# Patient Record
Sex: Female | Born: 1995 | Race: Asian | Hispanic: No | Marital: Married | Smoking: Never smoker
Health system: Southern US, Community
[De-identification: ages and names within clinical notes are randomized; demographics above are authoritative.]

## PROBLEM LIST (undated history)

## (undated) DIAGNOSIS — O24419 Gestational diabetes mellitus in pregnancy, unspecified control: Secondary | ICD-10-CM

---

## 2022-02-06 ENCOUNTER — Encounter: Payer: Self-pay | Admitting: General Practice

## 2022-02-17 ENCOUNTER — Inpatient Hospital Stay (HOSPITAL_COMMUNITY): Payer: Medicaid Other

## 2022-02-17 ENCOUNTER — Inpatient Hospital Stay (HOSPITAL_COMMUNITY)
Admission: AD | Admit: 2022-02-17 | Discharge: 2022-02-17 | Disposition: A | Discharge status: Home / Self Care | Payer: Medicaid Other | Attending: Obstetrics and Gynecology | Admitting: Obstetrics and Gynecology

## 2022-02-17 ENCOUNTER — Encounter (HOSPITAL_COMMUNITY): Payer: Self-pay | Admitting: Obstetrics and Gynecology

## 2022-02-17 DIAGNOSIS — R1032 Left lower quadrant pain: Secondary | ICD-10-CM | POA: Diagnosis not present

## 2022-02-17 DIAGNOSIS — Z3A08 8 weeks gestation of pregnancy: Secondary | ICD-10-CM | POA: Diagnosis not present

## 2022-02-17 DIAGNOSIS — O26891 Other specified pregnancy related conditions, first trimester: Secondary | ICD-10-CM | POA: Diagnosis not present

## 2022-02-17 DIAGNOSIS — Z6790 Unspecified blood type, Rh positive: Secondary | ICD-10-CM

## 2022-02-17 DIAGNOSIS — O021 Missed abortion: Secondary | ICD-10-CM | POA: Diagnosis not present

## 2022-02-17 DIAGNOSIS — R109 Unspecified abdominal pain: Secondary | ICD-10-CM | POA: Diagnosis present

## 2022-02-17 HISTORY — DX: Gestational diabetes mellitus in pregnancy, unspecified control: O24.419

## 2022-02-17 LAB — URINALYSIS, ROUTINE W REFLEX MICROSCOPIC
Bilirubin Urine: NEGATIVE
Glucose, UA: NEGATIVE mg/dL
Hgb urine dipstick: NEGATIVE
Ketones, ur: NEGATIVE mg/dL
Leukocytes,Ua: NEGATIVE
Nitrite: NEGATIVE
Protein, ur: NEGATIVE mg/dL
Specific Gravity, Urine: 1.024 (ref 1.005–1.030)
pH: 6 (ref 5.0–8.0)

## 2022-02-17 LAB — ABO/RH: ABO/RH(D): O POS

## 2022-02-17 LAB — WET PREP, GENITAL
Clue Cells Wet Prep HPF POC: NONE SEEN
Sperm: NONE SEEN
Trich, Wet Prep: NONE SEEN
WBC, Wet Prep HPF POC: >=10 — AB (ref <10)

## 2022-02-17 LAB — CBC
HCT: 38.6 % (ref 36.0–46.0)
Hemoglobin: 12.8 g/dL (ref 12.0–15.0)
MCH: 27.6 pg (ref 26.0–34.0)
MCHC: 33.2 g/dL (ref 30.0–36.0)
MCV: 83.2 fL (ref 80.0–100.0)
Platelets: 197 10*3/uL (ref 150–400)
RBC: 4.64 MIL/uL (ref 3.87–5.11)
RDW: 13.3 % (ref 11.5–15.5)
WBC: 6.8 10*3/uL (ref 4.0–10.5)
nRBC: 0 % (ref 0.0–0.2)

## 2022-02-17 LAB — POCT PREGNANCY, URINE: Preg Test, Ur: POSITIVE — AB

## 2022-02-17 LAB — HCG, QUANTITATIVE, PREGNANCY: hCG, Beta Chain, Quant, S: 11719 m[IU]/mL — ABNORMAL HIGH (ref <5)

## 2022-02-17 MED ORDER — PROMETHAZINE HCL 12.5 MG PO TABS
12.5000 mg | ORAL_TABLET | Freq: Four times a day (QID) | ORAL | 0 refills | Status: DC | PRN
Start: 2022-02-17 — End: 2022-02-18

## 2022-02-17 MED ORDER — HYDROCODONE-ACETAMINOPHEN 5-325 MG PO TABS: 2.0000 | ORAL_TABLET | ORAL | 0 refills | Status: DC | PRN

## 2022-02-17 MED ORDER — MISOPROSTOL 200 MCG PO TABS
800.0000 ug | ORAL_TABLET | Freq: Once | ORAL | Status: AC
Start: 2022-02-17 — End: 2022-02-17
  Administered 2022-02-17: 800 ug via ORAL
  Filled 2022-02-17: qty 4

## 2022-02-17 MED ORDER — HYDROCODONE-ACETAMINOPHEN 5-325 MG PO TABS
2.0000 | ORAL_TABLET | Freq: Once | ORAL | Status: AC
  Administered 2022-02-17: 2 via ORAL
  Filled 2022-02-17: qty 2

## 2022-02-17 MED ORDER — IBUPROFEN 600 MG PO TABS: 600.0000 mg | ORAL_TABLET | Freq: Four times a day (QID) | ORAL | 3 refills | Status: DC | PRN

## 2022-02-17 NOTE — MAU Provider Note (Addendum)
?History  ?  ? ?CSN: [id number] ? ?Arrival date and time: 02/17/22 1015 ? ? Event Date/Time  ? First Provider Initiated Contact with Patient 02/17/22 1119   ?  ? ?Chief Complaint  ?Patient presents with  ? Abdominal Pain  ? Constipation  ? Nausea  ? Emesis  ? Vaginal Discharge  ? Vaginal Itching  ? ?HPI ? ?Kristina Hutchinson 26 y.o. presents to MAU with complaints of left sided abdominal pain and concerns for constipation that began 1 day ago. Pt describes the pain as intermittent and "cramping like". She states that the pain is not relieved with movement or rest. Pt states she is currently experiencing hemorrhoids and using OTC PrepH cream with "some relief." Pt states that she had a positive pregnancy test at home 1 week ago and has a confirmation of pregnancy test scheduled for 03/21/22 with [facility]. Pt denies vaginal bleeding, and leaking of fluid.  ? ?OB History   ? ? Gravida  ?3  ? Para  ?   ? Term  ?   ? Preterm  ?   ? AB  ?   ? Living  ?2  ?  ? ? SAB  ?   ? IAB  ?   ? Ectopic  ?   ? Multiple  ?   ? Live Births  ?2  ?   ?  ?  ? ? ?Past Medical History:  ?Diagnosis Date  ? Gestational diabetes   ? ? ?Past Surgical History:  ?Procedure Laterality Date  ? CESAREAN SECTION    ? ? ?History reviewed. No pertinent family history. ? ?Social History  ? ?Tobacco Use  ? Smoking status: Never  ?  Passive exposure: Never  ? Smokeless tobacco: Never  ?Vaping Use  ? Vaping Use: Never used  ?Substance Use Topics  ? Alcohol use: Never  ? Drug use: Never  ? ? ?Allergies: No Known Allergies ? ?Medications Prior to Admission  ?Medication Sig Dispense Refill Last Dose  ? Prenatal Vit-Fe Fumarate-FA (MULTIVITAMIN-PRENATAL) 27-0.8 MG TABS tablet Take 1 tablet by mouth daily at 12 noon.   02/16/2022  ? ? ?Review of Systems  ?Constitutional:  Negative for appetite change.  ?Respiratory:  Negative for shortness of breath, wheezing and stridor.   ?Gastrointestinal:  Positive for abdominal pain and constipation. Negative for diarrhea,  nausea, rectal pain and vomiting.  ?Genitourinary:  Negative for frequency, hematuria, pelvic pain, urgency, vaginal bleeding, vaginal discharge and vaginal pain.  ?Skin: Negative.   ?Physical Exam  ? ?Blood pressure (!) 106/57, pulse 62, temperature 98.6 ?F (37 ?C), temperature source Oral, resp. rate 15, height 5\' 2"  (1.575 m), weight 79 kg, last menstrual period 12/06/2021, SpO2 100 %. ? ?Physical Exam ?Abdominal:  ?   General: There is no distension.  ?   Palpations: Abdomen is soft.  ?   Tenderness: There is no abdominal tenderness. There is no guarding.  ?Skin: ?   General: Skin is warm and dry.  ?Neurological:  ?   Mental Status: She is oriented to person, place, and time.  ?Psychiatric:     ?   Mood and Affect: Mood normal.     ?   Behavior: Behavior normal.  ? ? ?MAU Course  ?Procedures ? ?MDM ?Orders Placed This Encounter  ?Procedures  ? Wet prep, genital  ? 12/08/2021 OB Comp Less 14 Wks  ? Urinalysis, Routine w reflex microscopic Urine, Clean Catch  ? CBC  ? hCG, quantitative, pregnancy  ? Nursing  communication  ? Pregnancy, urine POC  ? ABO/Rh  ? ?Results for orders placed or performed during the hospital encounter of 02/17/22 (from the past 24 hour(s))  ?Pregnancy, urine POC     Status: Abnormal  ? Collection Time: 02/17/22 10:50 AM  ?Result Value Ref Range  ? Preg Test, Ur POSITIVE (A) NEGATIVE  ?Urinalysis, Routine w reflex microscopic Urine, Clean Catch     Status: None  ? Collection Time: 02/17/22 11:04 AM  ?Result Value Ref Range  ? Color, Urine YELLOW YELLOW  ? APPearance CLEAR CLEAR  ? Specific Gravity, Urine 1.024 1.005 - 1.030  ? pH 6.0 5.0 - 8.0  ? Glucose, UA NEGATIVE NEGATIVE mg/dL  ? Hgb urine dipstick NEGATIVE NEGATIVE  ? Bilirubin Urine NEGATIVE NEGATIVE  ? Ketones, ur NEGATIVE NEGATIVE mg/dL  ? Protein, ur NEGATIVE NEGATIVE mg/dL  ? Nitrite NEGATIVE NEGATIVE  ? Leukocytes,Ua NEGATIVE NEGATIVE  ?Wet prep, genital     Status: Abnormal  ? Collection Time: 02/17/22 11:44 AM  ? Specimen: Vaginal   ?Result Value Ref Range  ? Yeast Wet Prep HPF POC PRESENT (A) NONE SEEN  ? Trich, Wet Prep NONE SEEN NONE SEEN  ? Clue Cells Wet Prep HPF POC NONE SEEN NONE SEEN  ? WBC, Wet Prep HPF POC >=10 (A) <10  ? Sperm NONE SEEN   ?CBC     Status: None  ? Collection Time: 02/17/22 11:56 AM  ?Result Value Ref Range  ? WBC 6.8 4.0 - 10.5 K/uL  ? RBC 4.64 3.87 - 5.11 MIL/uL  ? Hemoglobin 12.8 12.0 - 15.0 g/dL  ? HCT 38.6 36.0 - 46.0 %  ? MCV 83.2 80.0 - 100.0 fL  ? MCH 27.6 26.0 - 34.0 pg  ? MCHC 33.2 30.0 - 36.0 g/dL  ? RDW 13.3 11.5 - 15.5 %  ? Platelets 197 150 - 400 K/uL  ? nRBC 0.0 0.0 - 0.2 %  ?ABO/Rh     Status: None (Preliminary result)  ? Collection Time: 02/17/22 11:56 AM  ?Result Value Ref Range  ? ABO/RH(D) PENDING   ? ?US OB Comp Less 14 Wks ? ?Result Date: 02/17/2022 ?CLINICAL DATA:  LEFT lower quadrant cramping for 1 day, first trimester pregnancy, LMP 12/06/2021, no quantitative beta HCG currently available for comparison EXAM: OBSTETRIC <14 WK US AND TRANSVAGINAL OB US TECHNIQUE: Both transabdominal and transvaginal ultrasound examinations were performed for complete evaluation of the gestation as well as the maternal uterus, adnexal regions, and pelvic cul-de-sac. Transvaginal technique was performed to assess early pregnancy. COMPARISON:  None FINDINGS: Intrauterine gestational sac: Present, single, slightly irregular Yolk sac:  Not identified Embryo:  Present Cardiac Activity: Not identified Heart Rate: N/A  bpm CRL:  21.2 mm   8 w   5 d                  US EDC: 09/24/2022 Subchorionic hemorrhage:  None visualized. Maternal uterus/adnexae: Remainder of uterus unremarkable. Ovaries normal appearance. No free pelvic fluid or adnexal masses. IMPRESSION: Intrauterine gestational sac containing a fetal pole which corresponds to 8 weeks 5 days EGA. No fetal cardiac activity identified, and no yolk sac seen. Findings meet definitive criteria for failed pregnancy. This follows SRU consensus guidelines: Diagnostic  Criteria for Nonviable Pregnancy Early in the First Trimester. Macy Mis Engl J Med [phone];[id number]. Electronically Signed   By: Ulyses SouthwardMark  Boles M.D.   On: 02/17/2022 12:39   ? ?Early Intrauterine Pregnancy Failure ? ?__x_  Documented intrauterine pregnancy failure less  than or equal to [redacted] weeks gestation ? ?__x_  No serious current illness ? ?__x_  Counseling by practitioner or physician ? ? ?_x__  Medication dispensed ? ? _x__   Cytotec 800 mcg  __   Intravaginally by patient at home ?  ?      _x_   Orally in MAU ? ?      __   Rectally by patient at home ? ?      __   Rectally by RN in MAU ? ?__x_  Ibuprofen 600 mg 1 tablet by mouth every 6 hours as needed #30 ? ?_x__  Hydrocodone/acetaminophen 5/325 mg by mouth every 4 to 6 hours as needed ? ?__x_  Phenergan 12.5 mg by mouth every 4 hours as needed for nausea ? ?Meds ordered this encounter  ?Medications  ? misoprostol (CYTOTEC) tablet 800 mcg  ?  Missed Abortion  ? HYDROcodone-acetaminophen (NORCO/VICODIN) 5-325 MG per tablet 2 tablet  ? HYDROcodone-acetaminophen (NORCO/VICODIN) 5-325 MG tablet  ?  Sig: Take 2 tablets by mouth every 4 (four) hours as needed for up to 3 days.  ?  Dispense:  6 tablet  ?  Refill:  0  ?  Order Specific Question:   Supervising Provider  ?  AnswerRandolph Bing [[id number]]  ? promethazine (PHENERGAN) 12.5 MG tablet  ?  Sig: Take 1 tablet (12.5 mg total) by mouth every 6 (six) hours as needed for nausea or vomiting.  ?  Dispense:  30 tablet  ?  Refill:  0  ?  Order Specific Question:   Supervising Provider  ?  AnswerRandolph Bing [[id number]]  ? ibuprofen (ADVIL) 600 MG tablet  ?  Sig: Take 1 tablet (600 mg total) by mouth every 6 (six) hours as needed.  ?  Dispense:  60 tablet  ?  Refill:  3  ?  Order Specific Question:   Supervising Provider  ?  AnswerRandolph Bing [[id number]]  ?  ?Assessment and Plan  ?Missed AB noted on ultrasound. Plan of care discussed with Pt and husband by S. Weinhold CNM and S. Suzie Portela CNM. Medication  intervention versus expectant management options discussed in depth with patient and husband. Bleeding expectations at home discussed, and bleeding precautions provided. All questions and concerns of pt and husband rev

## 2022-02-17 NOTE — MAU Note (Signed)
...  Kristina Hutchinson is a 26 y.o. at approximately [redacted]w[redacted]d here in MAU reporting: Left lower abdominal cramping that began yesterday around 1400. She reports she has been constipated but was able to pass a small amount of stool yesterday. She is also reporting occasional nausea and vomits on some mornings. Denies VB or LOF. Endorses malodorous thick white discharge accompanied with itching for two weeks now. Last IC two days ago. ? ?First OB appointment on 5/30 but unsure of where. Reports having pregnancy verified with [facility]. ? ?LMP: 12/06/2021 ? ?Pain score: 5/10 left lower to left mid abdomen ? ?FHT: unable to doppler ?Lab orders placed from triage:  UA ? ?

## 2022-02-17 NOTE — Discharge Instructions (Signed)
What to expect after the misoprostol  Cramping, moderate to heavy bleeding, and moderate pain are normal parts of the abortion process as your uterus passes the pregnancy. Most women pass blood clots.  Cramping usually starts one to four hours after you place the misoprostol in your vagina. Bleeding usually starts between 30 minutes to four hours after you place the misoprostol in your vagina. However, it can take up to 24 hours for some women. Heavy bleeding and strong cramps usually last between one and four hours. Call or return to the hospital if you soak through more than two large maxi-pads per hour for three hours in a row.  For pain: Resting and using a heating pad or hot water bottle may help. Both Vicodin and ibuprofen usually help with the pain. You may use either one or both together. Call if your pain is not manageable with Vicodin and ibuprofen.  Vicodin: You may take one or two tablets of Vicodin every four to six hours. You may take the first pill as soon as you feel you need something for the pain. Vicodin may make you feel sleepy, nauseated, or dizzy. Do not exceed this dose.  Ibuprofen (Motrin, Advil, etc.): You may take 800mg of ibuprofen (four over-the-counter tablets) every six to eight hours. You may take the first dose of pills as soon as you feel you need something for the pain. Ibuprofen usually does not cause side effects such as sleepiness, nausea, or dizziness.  Other misoprostol side effects:  The following side effects are not dangerous and usually only last one to four hours. If any of these side effects make you very uncomfortable, you may treat your symptoms with over-the-counter medicines. Call if over-the-counter medicines do not relieve your symptoms.  Nausea or vomiting  Call if you vomit several times and cannot hold down liquids. Over the counter treatment: Benadryl 25mg to 50mg every six hours as needed, or Dramamine 25mg to 50mg every six hours as  needed.  Diarrhea  Call if you have several episodes of diarrhea lasting more than four to five hours and you are having trouble drinking liquids (you may be getting dehydrated). Over the counter treatment: Imodium 2 tablets (4mg) once.  Fever, chills  Misoprostol may cause fever or chills in the first 24 hours. After 24 hours, fever or chills may be a sign of an infection and you should call the clinic. If you already took Tylenol, Vicodin (which has 500mg of Tylenol in it), or ibuprofen for pain, do not take more of the same medication for fever or chills. Call the clinic if your fever is higher than 101 F (or 39 C). Over the counter treatment: Tylenol 500 to 650mg every four hours, Ibuprofen 800mg (four over-the-counter pills) every six to eight hours. How to tell if the abortion is complete  Most women have bleeding and painful cramping. As you pass the pregnancy, the bleeding is usually heavy and the cramping very strong. This usually lasts one to four hours. Most women pass some blood clots in the toilet and the pregnancy is often one of those clots. After the pregnancy passes, the cramps decrease and the bleeding slows down significantly.  Within a few hours after passing the pregnancy, cramps and bleeding should be much improved  

## 2022-02-18 ENCOUNTER — Telehealth: Payer: Self-pay

## 2022-02-18 ENCOUNTER — Other Ambulatory Visit: Payer: Self-pay | Admitting: Family Medicine

## 2022-02-18 MED ORDER — IBUPROFEN 600 MG PO TABS: 600.0000 mg | ORAL_TABLET | Freq: Four times a day (QID) | ORAL | 3 refills | Status: DC | PRN

## 2022-02-18 MED ORDER — HYDROCODONE-ACETAMINOPHEN 5-325 MG PO TABS: 2.0000 | ORAL_TABLET | ORAL | 0 refills | Status: AC | PRN

## 2022-02-18 MED ORDER — PROMETHAZINE HCL 12.5 MG PO TABS: 12.5000 mg | ORAL_TABLET | Freq: Four times a day (QID) | ORAL | 0 refills | Status: DC | PRN

## 2022-02-18 NOTE — Telephone Encounter (Signed)
MD on call called this RNCM back and will change the prescriptions over to [facility] in [place]. per request.  ?

## 2022-02-18 NOTE — Telephone Encounter (Signed)
Called office and left message for provider to call back re patients original prescribed medication is  MS. Weinhold.  She is not on today but her on call has been paged. ?

## 2022-02-18 NOTE — Telephone Encounter (Signed)
Please see subsequent encounters.

## 2022-02-21 LAB — GC/CHLAMYDIA PROBE AMP (~~LOC~~) NOT AT ARMC
Chlamydia: NEGATIVE
Comment: NEGATIVE
Comment: NORMAL
Neisseria Gonorrhea: NEGATIVE

## 2022-02-22 ENCOUNTER — Ambulatory Visit (INDEPENDENT_AMBULATORY_CARE_PROVIDER_SITE_OTHER): Payer: Medicaid Other | Admitting: Family Medicine

## 2022-02-22 ENCOUNTER — Encounter: Payer: Self-pay | Admitting: Family Medicine

## 2022-02-22 VITALS — BP 99/58 | HR 64 | Wt 175.0 lb

## 2022-02-22 DIAGNOSIS — O021 Missed abortion: Secondary | ICD-10-CM | POA: Diagnosis not present

## 2022-02-22 NOTE — Progress Notes (Signed)
? ?  Subjective:  ? ? Patient ID: Kristina Hutchinson, female    DOB: 23-Jan-1996, 26 y.o.   MRN: [id number] ? ?HPI ?This is a 26 year old G3 P2-0-0-2 who presents with missed AB that was diagnosed in the MAU on 4/28.  She was given Cytotec in the MAU.  She states that she had heavy bleeding, cramping, passing blood clots and tissue.  She is still having some spotting, but not cramping. ? ?Review of Systems ? ?   ?Objective:  ? Physical Exam ?Vitals reviewed.  ?Constitutional:   ?   Appearance: Normal appearance.  ?Abdominal:  ?   General: Abdomen is flat. There is no distension.  ?   Palpations: Abdomen is soft. There is no mass.  ?   Tenderness: There is no abdominal tenderness. There is no guarding or rebound.  ?   Hernia: No hernia is present.  ?Skin: ?   Capillary Refill: Capillary refill takes less than 2 seconds.  ?Neurological:  ?   Mental Status: She is alert.  ?Psychiatric:     ?   Mood and Affect: Mood normal.     ?   Behavior: Behavior normal.     ?   Thought Content: Thought content normal.     ?   Judgment: Judgment normal.  ? ? ?   ?Assessment & Plan:  ?1. Missed ab ?Check hCG today.  I discussed with the patient that this is something that is fairly common and that approximately 15% of pregnancies in her age range and up in a miscarriage.  Also discussed that this does not impact fertility and that she should still be able to get pregnant.  I did advise that she wait at least 2-3 cycles before attempting to become pregnant again.  We will follow-up in 3 weeks to assure that everything is completed. ?- Beta hCG quant (ref lab) ? ?

## 2022-02-23 LAB — BETA HCG QUANT (REF LAB): hCG Quant: 3338 m[IU]/mL

## 2022-02-24 ENCOUNTER — Emergency Department (HOSPITAL_BASED_OUTPATIENT_CLINIC_OR_DEPARTMENT_OTHER)
Admission: EM | Admit: 2022-02-24 | Discharge: 2022-02-25 | Disposition: A | Discharge status: Home / Self Care | Payer: Medicaid Other | Attending: Emergency Medicine | Admitting: Emergency Medicine

## 2022-02-24 ENCOUNTER — Other Ambulatory Visit: Payer: Self-pay | Admitting: Family Medicine

## 2022-02-24 ENCOUNTER — Encounter (HOSPITAL_BASED_OUTPATIENT_CLINIC_OR_DEPARTMENT_OTHER): Payer: Self-pay | Admitting: Emergency Medicine

## 2022-02-24 ENCOUNTER — Other Ambulatory Visit: Payer: Self-pay

## 2022-02-24 DIAGNOSIS — R1030 Lower abdominal pain, unspecified: Secondary | ICD-10-CM | POA: Diagnosis not present

## 2022-02-24 DIAGNOSIS — O021 Missed abortion: Secondary | ICD-10-CM | POA: Insufficient documentation

## 2022-02-24 DIAGNOSIS — O26891 Other specified pregnancy related conditions, first trimester: Secondary | ICD-10-CM | POA: Insufficient documentation

## 2022-02-24 LAB — CBC WITH DIFFERENTIAL/PLATELET
Abs Immature Granulocytes: 0.04 10*3/uL (ref 0.00–0.07)
Basophils Absolute: 0.1 10*3/uL (ref 0.0–0.1)
Basophils Relative: 1 %
Eosinophils Absolute: 0.3 10*3/uL (ref 0.0–0.5)
Eosinophils Relative: 3 %
HCT: 35.3 % — ABNORMAL LOW (ref 36.0–46.0)
Hemoglobin: 11.7 g/dL — ABNORMAL LOW (ref 12.0–15.0)
Immature Granulocytes: 0 %
Lymphocytes Relative: 31 %
Lymphs Abs: 3.3 10*3/uL (ref 0.7–4.0)
MCH: 27.7 pg (ref 26.0–34.0)
MCHC: 33.1 g/dL (ref 30.0–36.0)
MCV: 83.5 fL (ref 80.0–100.0)
Monocytes Absolute: 0.7 10*3/uL (ref 0.1–1.0)
Monocytes Relative: 6 %
Neutro Abs: 6.2 10*3/uL (ref 1.7–7.7)
Neutrophils Relative %: 59 %
Platelets: 229 10*3/uL (ref 150–400)
RBC: 4.23 MIL/uL (ref 3.87–5.11)
RDW: 13.2 % (ref 11.5–15.5)
WBC: 10.6 10*3/uL — ABNORMAL HIGH (ref 4.0–10.5)
nRBC: 0 % (ref 0.0–0.2)

## 2022-02-24 MED ORDER — OXYCODONE-ACETAMINOPHEN 5-325 MG PO TABS
1.0000 | ORAL_TABLET | Freq: Four times a day (QID) | ORAL | 0 refills | Status: DC | PRN
Start: 2022-02-24 — End: 2022-03-15

## 2022-02-24 MED ORDER — OXYCODONE-ACETAMINOPHEN 5-325 MG PO TABS
1.0000 | ORAL_TABLET | Freq: Four times a day (QID) | ORAL | 0 refills | Status: DC | PRN
Start: 2022-02-24 — End: 2022-02-24

## 2022-02-24 MED ORDER — LACTATED RINGERS IV BOLUS
1000.0000 mL | Freq: Once | INTRAVENOUS | Status: AC
  Administered 2022-02-24: 1000 mL via INTRAVENOUS

## 2022-02-24 MED ORDER — FENTANYL CITRATE PF 50 MCG/ML IJ SOSY
50.0000 ug | PREFILLED_SYRINGE | Freq: Once | INTRAMUSCULAR | Status: AC
  Administered 2022-02-24: 50 ug via INTRAVENOUS
  Filled 2022-02-24: qty 1

## 2022-02-24 MED ORDER — MISOPROSTOL 200 MCG PO TABS: ORAL_TABLET | ORAL | 0 refills | Status: DC

## 2022-02-24 NOTE — Progress Notes (Signed)
Called patient and her husband with results. Hormone level still high - 3300. Will repeat cytotec. Instructions given to patient. Percocet also sent to pharmacy.  ?

## 2022-02-24 NOTE — ED Triage Notes (Signed)
Pt presents c/o lower abdominal pain, vaginal bleeding, and n/v x 2 hours. Recently seen at MAU on 4/28 for missed abortion. Took cytotec once. Was told her hcg levels were still elevated and prescribed another cytotec, but did not take it yet due to current sx.  ?

## 2022-02-24 NOTE — ED Provider Notes (Signed)
? ?[facility] EMERGENCY DEPARTMENT  ?Provider Note ? ?CSN: [id number] ?Arrival date & time: 02/24/22 2315 ? ?History ?Chief Complaint  ?Patient presents with  ? Abdominal Pain  ? Emesis  ? ? ?Kristina Hutchinson is a 26 y.o. female seen at MAU on 4/28 for early pregnancy bleeding and found to have miscarriage. Given cytotec and had heavy bleeding and cramping the following day and then only spotting afterwards. She was seen in Ob clinic for follow up 5/3 and had labs drawn. She was called today due to HCG still being >3000 and advised another dose of cytotec which she has not yet taken (planning to take it in the morning) but pain and bleeding returned this evening. Heavy, clots and lower abdominal cramping. No fevers. No vomiting.  ? ? ?Home Medications ?Prior to Admission medications   ?Medication Sig Start Date End Date Taking? Authorizing Provider  ?ibuprofen (ADVIL) 600 MG tablet Take 1 tablet (600 mg total) by mouth every 6 (six) hours as needed. ?Patient not taking: Reported on 02/22/2022 02/18/22   Reva Bores, MD  ?misoprostol (CYTOTEC) 200 MCG tablet Take 2 tablets (400 mcg total) by mouth 3 (three) times daily for 3 days. Place four tablets in between your gums and cheeks (two tablets on each side) as instructed 02/25/22 02/28/22  Pollyann Savoy, MD  ?oxyCODONE-acetaminophen (PERCOCET/ROXICET) 5-325 MG tablet Take 1-2 tablets by mouth every 6 (six) hours as needed. 02/24/22   Levie Heritage, DO  ?Prenatal Vit-Fe Fumarate-FA (MULTIVITAMIN-PRENATAL) 27-0.8 MG TABS tablet Take 1 tablet by mouth daily at 12 noon.    [provider]  ?promethazine (PHENERGAN) 12.5 MG tablet Take 1 tablet (12.5 mg total) by mouth every 6 (six) hours as needed for nausea or vomiting. ?Patient not taking: Reported on 02/22/2022 02/18/22   Reva Bores, MD  ? ? ? ?Allergies    ?Patient has no known allergies. ? ? ?Review of Systems   ?Review of Systems ?Please see HPI for pertinent positives and negatives ? ?Physical  Exam ?BP (!) 93/57   Pulse 75   Temp 97.9 ?F (36.6 ?C)   Resp 16   LMP 12/06/2021 (Exact Date)   SpO2 100%  ? ?Physical Exam ?Vitals and nursing note reviewed.  ?Constitutional:   ?   Appearance: Normal appearance.  ?HENT:  ?   Head: Normocephalic and atraumatic.  ?   Nose: Nose normal.  ?   Mouth/Throat:  ?   Mouth: Mucous membranes are moist.  ?Eyes:  ?   Extraocular Movements: Extraocular movements intact.  ?   Conjunctiva/sclera: Conjunctivae normal.  ?Cardiovascular:  ?   Rate and Rhythm: Normal rate.  ?Pulmonary:  ?   Effort: Pulmonary effort is normal.  ?   Breath sounds: Normal breath sounds.  ?Abdominal:  ?   General: Abdomen is flat.  ?   Palpations: Abdomen is soft.  ?   Tenderness: There is abdominal tenderness in the suprapubic area. There is no guarding.  ?Musculoskeletal:     ?   General: No swelling. Normal range of motion.  ?   Cervical back: Neck supple.  ?Skin: ?   General: Skin is warm and dry.  ?Neurological:  ?   General: No focal deficit present.  ?   Mental Status: She is alert.  ?Psychiatric:     ?   Mood and Affect: Mood normal.  ? ? ?ED Results / Procedures / Treatments   ?EKG ?None ? ?Procedures ?Procedures ? ?Medications Ordered  in the ED ?Medications  ?lactated ringers bolus 1,000 mL (0 mLs Intravenous Stopped 02/25/22 0138)  ?fentaNYL (SUBLIMAZE) injection 50 mcg (50 mcg Intravenous Given 02/24/22 2350)  ?misoprostol (CYTOTEC) tablet 800 mcg (800 mcg Oral Given 02/25/22 0139)  ? ? ?Initial Impression and Plan ? Patient with vaginal bleeding in setting of recent miscarriage. Will check CBC and HCG, IVF for borderline BP and fentanyl for pain.  ? ?ED Course  ? ?Clinical Course as of 02/25/22 0157  ?Sat Feb 25, 2022  ?0000 CBC with mild decrease in Hgb compared to recent MAU visit.  [CS]  ?0130 Spoke with Dr. Despina Hidden, on call for Ob, he recommends giving her Cytotec tonight, then TID for the next three days. She has Rx for pain medications if needed. Advised to return to MAU if  pain is uncontrolled, she feels lightheaded or dizzy or if she develops a fever. Patient and family at bedside are in agreement.  [CS]  ?  ?Clinical Course User Index ?[CS] Pollyann Savoy, MD  ? ? ? ?MDM Rules/Calculators/A&P ?Medical Decision Making ?Given presenting complaint, I considered that admission might be necessary. After review of results from ED lab and/or imaging studies, admission to the hospital is not indicated at this time.  ? ? ?Problems Addressed: ?Missed abortion: acute illness or injury ? ?Amount and/or Complexity of Data Reviewed ?Labs: ordered. Decision-making details documented in ED Course. ? ?Risk ?Prescription drug management. ?Decision regarding hospitalization. ? ? ? ?Final Clinical Impression(s) / ED Diagnoses ?Final diagnoses:  ?Missed abortion  ? ? ?Rx / DC Orders ?ED Discharge Orders   ? ?      Ordered  ?  misoprostol (CYTOTEC) 200 MCG tablet  3 times daily       ? 02/25/22 0156  ? ?  ?  ? ?  ? ?  ?Pollyann Savoy, MD ?02/25/22 0157 ? ?

## 2022-02-25 LAB — HCG, QUANTITATIVE, PREGNANCY: hCG, Beta Chain, Quant, S: 2767 m[IU]/mL — ABNORMAL HIGH (ref <5)

## 2022-02-25 MED ORDER — MISOPROSTOL 200 MCG PO TABS
800.0000 ug | ORAL_TABLET | Freq: Once | ORAL | Status: AC
  Administered 2022-02-25: 800 ug via ORAL
  Filled 2022-02-25: qty 4

## 2022-02-25 MED ORDER — MISOPROSTOL 200 MCG PO TABS: 400.0000 ug | ORAL_TABLET | Freq: Three times a day (TID) | ORAL | 0 refills | Status: DC

## 2022-03-01 ENCOUNTER — Ambulatory Visit: Payer: Medicaid Other | Admitting: Obstetrics & Gynecology

## 2022-03-15 ENCOUNTER — Encounter: Payer: Self-pay | Admitting: Family Medicine

## 2022-03-15 ENCOUNTER — Ambulatory Visit (INDEPENDENT_AMBULATORY_CARE_PROVIDER_SITE_OTHER): Payer: Medicaid Other | Admitting: Family Medicine

## 2022-03-15 VITALS — BP 103/67 | HR 72 | Wt 179.0 lb

## 2022-03-15 DIAGNOSIS — O021 Missed abortion: Secondary | ICD-10-CM | POA: Diagnosis not present

## 2022-03-15 MED ORDER — PRENATAL 27-0.8 MG PO TABS
1.0000 | ORAL_TABLET | Freq: Every day | ORAL | 3 refills | Status: AC
Start: 2022-03-15 — End: ?

## 2022-03-15 NOTE — Progress Notes (Signed)
   Subjective:    Patient ID: Kristina Hutchinson, female    DOB: [birth year], 26 y.o.   MRN: [id number]  HPI Patient seen for follow-up of missed AB.  Was last seen in my office 5/5.  Had hCG of greater than 3000.  I had prescribed Cytotec for the patient, however the patient has not taken it yet.  Due to increased bleeding, she went to the emergency department and had repeat labs which showed an hCG of 2700.  She was given Cytotec 800 mcg in the emergency department, then was prescribed Cytotec 400 mcg 3 times daily for the next 3 days.  She was given a prescription for pain medicine.  Since that time she has passed large clots and has had some bleeding.  Since then, she has had no further bleeding or pain.   Review of Systems     Objective:   Physical Exam Vitals reviewed.  Constitutional:      Appearance: Normal appearance.  Abdominal:     General: Abdomen is flat. There is no distension.     Palpations: Abdomen is soft. There is no mass.     Tenderness: There is no abdominal tenderness.  Skin:    General: Skin is warm and dry.     Capillary Refill: Capillary refill takes less than 2 seconds.  Neurological:     General: No focal deficit present.     Mental Status: She is alert.  Psychiatric:        Mood and Affect: Mood normal.        Behavior: Behavior normal.        Thought Content: Thought content normal.      Assessment & Plan:  1. Missed ab Rpt HCG level today.  Patient would like to become pregnant shortly. Recommended continuing PNV.  Recommended waiting 3 months, then trying to get pregnant  - Beta hCG quant (ref lab)

## 2022-03-16 LAB — BETA HCG QUANT (REF LAB): hCG Quant: 3 m[IU]/mL

## 2022-03-21 ENCOUNTER — Encounter: Payer: Self-pay | Admitting: Advanced Practice Midwife

## 2022-05-18 ENCOUNTER — Ambulatory Visit (INDEPENDENT_AMBULATORY_CARE_PROVIDER_SITE_OTHER): Payer: Medicaid Other | Admitting: Family Medicine

## 2022-05-18 ENCOUNTER — Other Ambulatory Visit (HOSPITAL_COMMUNITY)
Admission: RE | Admit: 2022-05-18 | Discharge: 2022-05-18 | Disposition: A | Discharge status: Home / Self Care | Payer: Medicaid Other | Source: Ambulatory Visit | Attending: Family Medicine | Admitting: Family Medicine

## 2022-05-18 VITALS — BP 104/52 | HR 61 | Wt 178.0 lb

## 2022-05-18 DIAGNOSIS — Z3169 Encounter for other general counseling and advice on procreation: Secondary | ICD-10-CM

## 2022-05-18 DIAGNOSIS — N898 Other specified noninflammatory disorders of vagina: Secondary | ICD-10-CM | POA: Diagnosis not present

## 2022-05-18 NOTE — Progress Notes (Signed)
Had full term pregnancy 06-2019,2018 Miscarriage:May 2023

## 2022-05-18 NOTE — Progress Notes (Addendum)
    Subjective:    Patient ID: Kristina Hutchinson is a 26 y.o. female presenting with No chief complaint on file.  on 05/18/2022  HPI: Has h/o C-section x 2. Recent SAB at 8 wk 5d treated with cytotec x 2. Recent normal cycle. On PNV's. Wants another pregnancy. Reports some vaginal itching.  Review of Systems  Constitutional:  Negative for chills and fever.  Respiratory:  Negative for shortness of breath.   Cardiovascular:  Negative for chest pain.  Gastrointestinal:  Negative for abdominal pain, nausea and vomiting.  Genitourinary:  Positive for vaginal discharge. Negative for dysuria.  Skin:  Negative for rash.      Objective:    BP (!) 104/52   Pulse 61   Wt 178 lb (80.7 kg)   LMP 05/02/2022   Breastfeeding No   BMI 32.56 kg/m  Physical Exam Exam conducted with a chaperone present.  Constitutional:      General: She is not in acute distress.    Appearance: She is well-developed.  HENT:     Head: Normocephalic and atraumatic.  Eyes:     General: No scleral icterus. Cardiovascular:     Rate and Rhythm: Normal rate.  Pulmonary:     Effort: Pulmonary effort is normal.  Abdominal:     Palpations: Abdomen is soft.  Musculoskeletal:     Cervical back: Neck supple.  Skin:    General: Skin is warm and dry.  Neurological:     Mental Status: She is alert and oriented to person, place, and time.         Assessment & Plan:  Vaginal discharge - self swab--treat based on results. C/w yeast--rx sent in - Plan: Cervicovaginal ancillary only( [facility]), fluconazole (DIFLUCAN) 150 MG tablet  Encounter for preconception consultation - continue prenatal vits--ok to resume attmepts at pregnancy   Return if symptoms worsen or fail to improve.  Reva Bores, MD 05/18/2022 10:28 AM

## 2022-05-19 LAB — CERVICOVAGINAL ANCILLARY ONLY
Bacterial Vaginitis (gardnerella): NEGATIVE
Candida Glabrata: NEGATIVE
Candida Vaginitis: POSITIVE — AB
Comment: NEGATIVE
Comment: NEGATIVE
Comment: NEGATIVE
Comment: NEGATIVE
Trichomonas: NEGATIVE

## 2022-05-21 MED ORDER — FLUCONAZOLE 150 MG PO TABS: 150.0000 mg | ORAL_TABLET | Freq: Once | ORAL | 0 refills | Status: AC

## 2022-05-21 NOTE — Addendum Note (Signed)
Addended by: Reva Bores on: 05/21/2022 09:27 AM   Modules accepted: Orders

## 2022-05-26 DIAGNOSIS — G44209 Tension-type headache, unspecified, not intractable: Secondary | ICD-10-CM | POA: Diagnosis not present

## 2022-07-19 DIAGNOSIS — Z3201 Encounter for pregnancy test, result positive: Secondary | ICD-10-CM | POA: Diagnosis not present

## 2022-07-19 DIAGNOSIS — O219 Vomiting of pregnancy, unspecified: Secondary | ICD-10-CM | POA: Diagnosis not present

## 2022-07-19 DIAGNOSIS — O99891 Other specified diseases and conditions complicating pregnancy: Secondary | ICD-10-CM | POA: Diagnosis not present

## 2022-07-19 DIAGNOSIS — R531 Weakness: Secondary | ICD-10-CM | POA: Diagnosis not present

## 2022-07-19 DIAGNOSIS — Z3A00 Weeks of gestation of pregnancy not specified: Secondary | ICD-10-CM | POA: Diagnosis not present

## 2022-08-01 ENCOUNTER — Encounter: Payer: Medicaid Other | Admitting: Advanced Practice Midwife

## 2022-08-10 DIAGNOSIS — O210 Mild hyperemesis gravidarum: Secondary | ICD-10-CM | POA: Diagnosis not present

## 2022-08-10 DIAGNOSIS — Z23 Encounter for immunization: Secondary | ICD-10-CM | POA: Diagnosis not present

## 2022-08-10 DIAGNOSIS — Z3A11 11 weeks gestation of pregnancy: Secondary | ICD-10-CM | POA: Diagnosis not present

## 2022-08-10 DIAGNOSIS — O3680X0 Pregnancy with inconclusive fetal viability, not applicable or unspecified: Secondary | ICD-10-CM | POA: Diagnosis not present

## 2022-09-08 DIAGNOSIS — Z361 Encounter for antenatal screening for raised alphafetoprotein level: Secondary | ICD-10-CM | POA: Diagnosis not present

## 2022-09-08 DIAGNOSIS — B3731 Acute candidiasis of vulva and vagina: Secondary | ICD-10-CM | POA: Diagnosis not present

## 2022-09-08 DIAGNOSIS — O23592 Infection of other part of genital tract in pregnancy, second trimester: Secondary | ICD-10-CM | POA: Diagnosis not present

## 2022-09-08 DIAGNOSIS — Z3A15 15 weeks gestation of pregnancy: Secondary | ICD-10-CM | POA: Diagnosis not present

## 2022-10-05 DIAGNOSIS — Z3689 Encounter for other specified antenatal screening: Secondary | ICD-10-CM | POA: Diagnosis not present

## 2022-10-05 DIAGNOSIS — Z3A19 19 weeks gestation of pregnancy: Secondary | ICD-10-CM | POA: Diagnosis not present

## 2022-10-05 DIAGNOSIS — O99212 Obesity complicating pregnancy, second trimester: Secondary | ICD-10-CM | POA: Diagnosis not present

## 2022-10-30 DIAGNOSIS — Z362 Encounter for other antenatal screening follow-up: Secondary | ICD-10-CM | POA: Diagnosis not present

## 2022-12-11 DIAGNOSIS — O99213 Obesity complicating pregnancy, third trimester: Secondary | ICD-10-CM | POA: Diagnosis not present

## 2022-12-11 DIAGNOSIS — Z3689 Encounter for other specified antenatal screening: Secondary | ICD-10-CM | POA: Diagnosis not present

## 2022-12-11 DIAGNOSIS — Z23 Encounter for immunization: Secondary | ICD-10-CM | POA: Diagnosis not present

## 2022-12-11 DIAGNOSIS — E669 Obesity, unspecified: Secondary | ICD-10-CM | POA: Diagnosis not present

## 2022-12-11 DIAGNOSIS — Z3A29 29 weeks gestation of pregnancy: Secondary | ICD-10-CM | POA: Diagnosis not present

## 2022-12-12 DIAGNOSIS — Z3A29 29 weeks gestation of pregnancy: Secondary | ICD-10-CM | POA: Diagnosis not present

## 2022-12-12 DIAGNOSIS — O99810 Abnormal glucose complicating pregnancy: Secondary | ICD-10-CM | POA: Diagnosis not present

## 2023-01-11 DIAGNOSIS — Z3689 Encounter for other specified antenatal screening: Secondary | ICD-10-CM | POA: Diagnosis not present

## 2023-01-11 DIAGNOSIS — O24410 Gestational diabetes mellitus in pregnancy, diet controlled: Secondary | ICD-10-CM | POA: Diagnosis not present

## 2023-01-11 DIAGNOSIS — O99213 Obesity complicating pregnancy, third trimester: Secondary | ICD-10-CM | POA: Diagnosis not present

## 2023-02-05 DIAGNOSIS — Z3689 Encounter for other specified antenatal screening: Secondary | ICD-10-CM | POA: Diagnosis not present

## 2023-02-05 DIAGNOSIS — O24414 Gestational diabetes mellitus in pregnancy, insulin controlled: Secondary | ICD-10-CM | POA: Diagnosis not present

## 2023-02-05 DIAGNOSIS — Z3A36 36 weeks gestation of pregnancy: Secondary | ICD-10-CM | POA: Diagnosis not present

## 2023-02-05 DIAGNOSIS — O24410 Gestational diabetes mellitus in pregnancy, diet controlled: Secondary | ICD-10-CM | POA: Diagnosis not present

## 2023-02-05 DIAGNOSIS — N898 Other specified noninflammatory disorders of vagina: Secondary | ICD-10-CM | POA: Diagnosis not present

## 2023-02-13 DIAGNOSIS — Z6833 Body mass index (BMI) 33.0-33.9, adult: Secondary | ICD-10-CM | POA: Diagnosis not present

## 2023-02-13 DIAGNOSIS — O24424 Gestational diabetes mellitus in childbirth, insulin controlled: Secondary | ICD-10-CM | POA: Diagnosis not present

## 2023-02-20 DIAGNOSIS — O43893 Other placental disorders, third trimester: Secondary | ICD-10-CM | POA: Diagnosis not present

## 2023-02-20 DIAGNOSIS — Z3A39 39 weeks gestation of pregnancy: Secondary | ICD-10-CM | POA: Diagnosis not present

## 2023-02-20 DIAGNOSIS — O34211 Maternal care for low transverse scar from previous cesarean delivery: Secondary | ICD-10-CM | POA: Diagnosis not present

## 2023-02-20 DIAGNOSIS — O24424 Gestational diabetes mellitus in childbirth, insulin controlled: Secondary | ICD-10-CM | POA: Diagnosis not present

## 2023-02-20 DIAGNOSIS — O34219 Maternal care for unspecified type scar from previous cesarean delivery: Secondary | ICD-10-CM | POA: Diagnosis not present

## 2023-02-20 DIAGNOSIS — O99824 Streptococcus B carrier state complicating childbirth: Secondary | ICD-10-CM | POA: Diagnosis not present

## 2023-02-20 DIAGNOSIS — O24429 Gestational diabetes mellitus in childbirth, unspecified control: Secondary | ICD-10-CM | POA: Diagnosis not present

## 2023-02-20 DIAGNOSIS — Z8759 Personal history of other complications of pregnancy, childbirth and the puerperium: Secondary | ICD-10-CM | POA: Diagnosis not present

## 2023-03-28 DIAGNOSIS — Z8632 Personal history of gestational diabetes: Secondary | ICD-10-CM | POA: Diagnosis not present

## 2023-08-06 ENCOUNTER — Ambulatory Visit
Admission: RE | Admit: 2023-08-06 | Discharge: 2023-08-06 | Disposition: A | Discharge status: Home / Self Care | Payer: Medicaid Other | Source: Ambulatory Visit | Attending: Internal Medicine | Admitting: Internal Medicine

## 2023-08-06 VITALS — BP 103/71 | HR 64 | Temp 98.1°F | Resp 16

## 2023-08-06 DIAGNOSIS — H65192 Other acute nonsuppurative otitis media, left ear: Secondary | ICD-10-CM

## 2023-08-06 MED ORDER — AMOXICILLIN 875 MG PO TABS
875.0000 mg | ORAL_TABLET | Freq: Two times a day (BID) | ORAL | 0 refills | Status: AC
Start: 2023-08-06 — End: 2023-08-13

## 2023-08-06 NOTE — ED Triage Notes (Signed)
Pt is here for left ear fuillness x 1 week. Pt denies any other symptoms. Pt took Motrin.

## 2023-08-06 NOTE — Discharge Instructions (Signed)
You have an ear infection so I have sent an antibiotic to treat this.  Follow-up if any symptoms persist or worsen.

## 2023-08-06 NOTE — ED Provider Notes (Signed)
[facility]    CSN: [id number] Arrival date & time: 08/06/23  1658      History   Chief Complaint Chief Complaint  Patient presents with   Ear Fullness    Entered by patient    HPI Shayley Medlin is a 27 y.o. female.   Patient presents with left ear fullness that has been present for approximately 1 week.  Reports that it was originally painful, but she took ibuprofen with resolution of pain.  Denies trauma or foreign body to the ear.  Denies any associated nasal congestion, runny nose, fever.   Ear Fullness    Past Medical History:  Diagnosis Date   Gestational diabetes     There are no problems to display for this patient.   Past Surgical History:  Procedure Laterality Date   CESAREAN SECTION      OB History     Gravida  3   Para  2   Term  2   Preterm      AB  1   Living  2      SAB  1   IAB      Ectopic      Multiple      Live Births  2            Home Medications    Prior to Admission medications   Medication Sig Start Date End Date Taking? Authorizing Provider  amoxicillin (AMOXIL) 875 MG tablet Take 1 tablet (875 mg total) by mouth 2 (two) times daily for 7 days. 08/06/23 08/13/23 Yes Danett Palazzo, Acie Fredrickson, FNP  Prenatal Vit-Fe Fumarate-FA (MULTIVITAMIN-PRENATAL) 27-0.8 MG TABS tablet Take 1 tablet by mouth daily at 12 noon. 03/15/22   Levie Heritage, DO    Family History No family history on file.  Social History Social History   Tobacco Use   Smoking status: Never    Passive exposure: Never   Smokeless tobacco: Never  Vaping Use   Vaping status: Never Used  Substance Use Topics   Alcohol use: Never   Drug use: Never     Allergies   Patient has no known allergies.   Review of Systems Review of Systems Per HPI  Physical Exam Triage Vital Signs ED Triage Vitals  Encounter Vitals Group     BP 08/06/23 1747 103/71     Systolic BP Percentile --      Diastolic BP Percentile --      Pulse Rate  08/06/23 1747 64     Resp 08/06/23 1747 16     Temp 08/06/23 1747 98.1 F (36.7 C)     Temp Source 08/06/23 1747 Oral     SpO2 08/06/23 1747 97 %     Weight --      Height --      Head Circumference --      Peak Flow --      Pain Score 08/06/23 1748 0     Pain Loc --      Pain Education --      Exclude from Growth Chart --    No data found.  Updated Vital Signs BP 103/71 (BP Location: Left Arm)   Pulse 64   Temp 98.1 F (36.7 C) (Oral)   Resp 16   LMP 07/28/2023 (Approximate)   SpO2 97%   Visual Acuity Right Eye Distance:   Left Eye Distance:   Bilateral Distance:    Right Eye Near:   Left Eye Near:  Bilateral Near:     Physical Exam Constitutional:      General: She is not in acute distress.    Appearance: Normal appearance. She is not toxic-appearing or diaphoretic.  HENT:     Head: Normocephalic and atraumatic.     Left Ear: Ear canal and external ear normal. No drainage, swelling or tenderness.  No middle ear effusion. There is no impacted cerumen. No foreign body. Tympanic membrane is erythematous. Tympanic membrane is not perforated or bulging.  Eyes:     Extraocular Movements: Extraocular movements intact.     Conjunctiva/sclera: Conjunctivae normal.  Pulmonary:     Effort: Pulmonary effort is normal.  Neurological:     General: No focal deficit present.     Mental Status: She is alert and oriented to person, place, and time. Mental status is at baseline.  Psychiatric:        Mood and Affect: Mood normal.        Behavior: Behavior normal.        Thought Content: Thought content normal.        Judgment: Judgment normal.      UC Treatments / Results  Labs (all labs ordered are listed, but only abnormal results are displayed) Labs Reviewed - No data to display  EKG   Radiology No results found.  Procedures Procedures (including critical care time)  Medications Ordered in UC Medications - No data to display  Initial Impression /  Assessment and Plan / UC Course  I have reviewed the triage vital signs and the nursing notes.  Pertinent labs & imaging results that were available during my care of the patient were reviewed by me and considered in my medical decision making (see chart for details).     Patient has left otitis media so will treat with amoxicillin antibiotic.  Advised strict follow-up if ear pain persists or worsens.  Patient denies that she is currently breast-feeding so that should be safe.  Patient verbalized understanding and was agreeable with plan. Final Clinical Impressions(s) / UC Diagnoses   Final diagnoses:  Other non-recurrent acute nonsuppurative otitis media of left ear     Discharge Instructions      You have an ear infection so I have sent an antibiotic to treat this.  Follow-up if any symptoms persist or worsen.    ED Prescriptions     Medication Sig Dispense Auth. Provider   amoxicillin (AMOXIL) 875 MG tablet Take 1 tablet (875 mg total) by mouth 2 (two) times daily for 7 days. 14 tablet [place], Acie Fredrickson, [place]      PDMP not reviewed this encounter.   Gustavus Bryant, [place] 08/06/23 1758

## 2023-08-13 DIAGNOSIS — Z0000 Encounter for general adult medical examination without abnormal findings: Secondary | ICD-10-CM | POA: Diagnosis not present

## 2023-08-13 DIAGNOSIS — M542 Cervicalgia: Secondary | ICD-10-CM | POA: Diagnosis not present

## 2023-08-13 DIAGNOSIS — H6692 Otitis media, unspecified, left ear: Secondary | ICD-10-CM | POA: Diagnosis not present

## 2023-12-13 IMAGING — US US OB COMP LESS 14 WK
1 series · 15 of 28 positions shown · non-contrast
Comparison: None

CLINICAL DATA: LEFT lower quadrant cramping for 1 day, first
trimester pregnancy, LMP 12/06/2021, no quantitative beta HCG
currently available for comparison

EXAM:
OBSTETRIC <14 WK US AND TRANSVAGINAL OB US
TECHNIQUE: Both transabdominal and transvaginal ultrasound examinations were
performed for complete evaluation of the gestation as well as the
maternal uterus, adnexal regions, and pelvic cul-de-sac.
Transvaginal technique was performed to assess early pregnancy.

[Series 1: us ob comp less 14 wk · 15 of 39 slices shown]
[im 1/39]
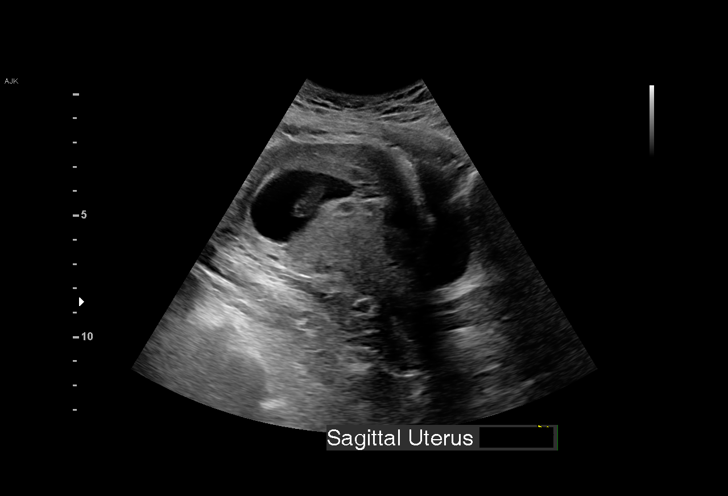
[im 3/39]
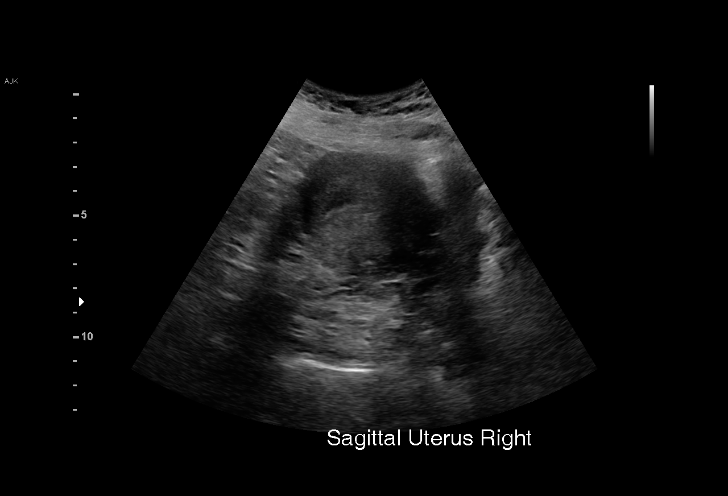
[im 6/39]
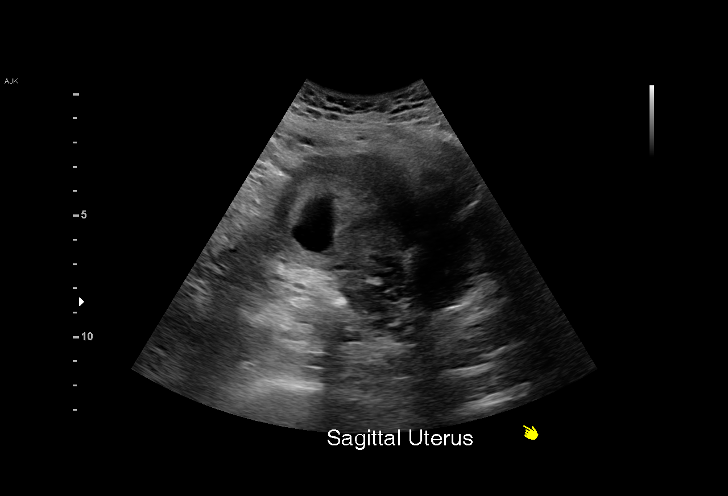
[im 9/39]
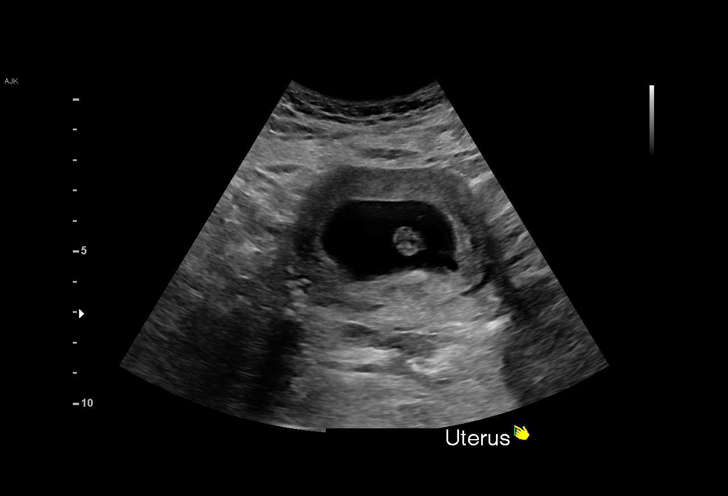
[im 12/39]
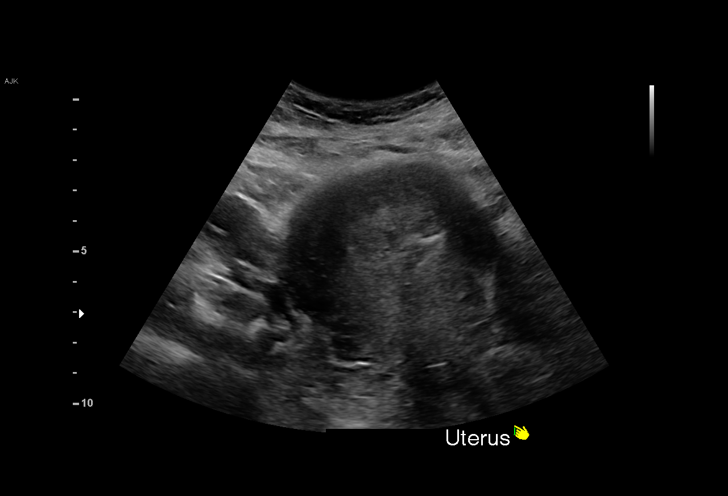
[im 15/39]
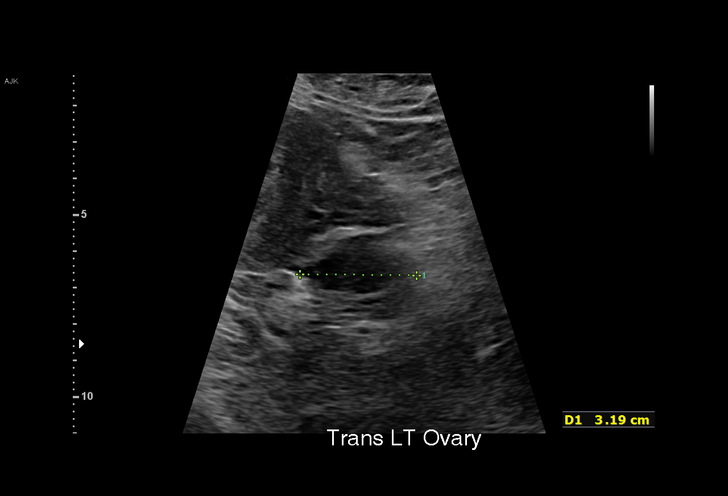
[im 17/39]
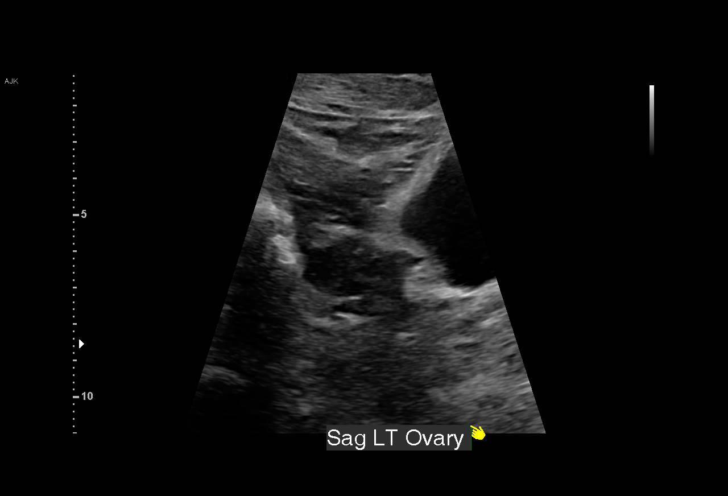
[im 20/39]
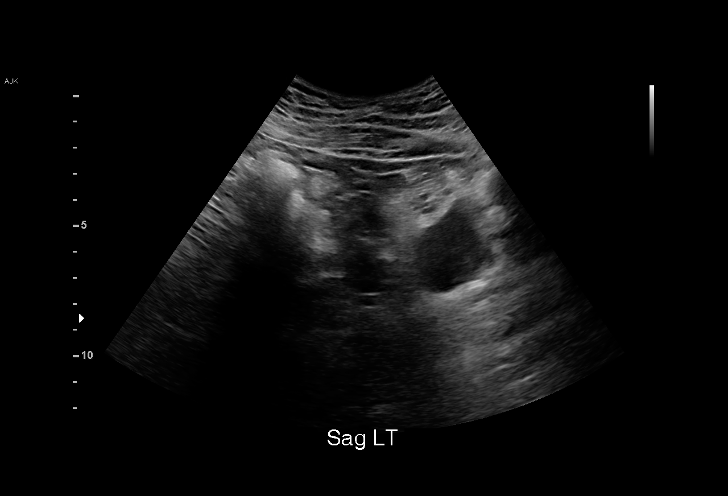
[im 22/39]
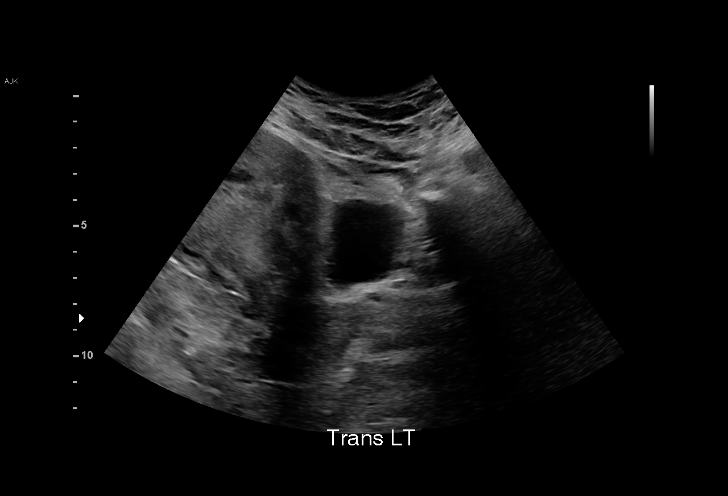
[im 24/39]
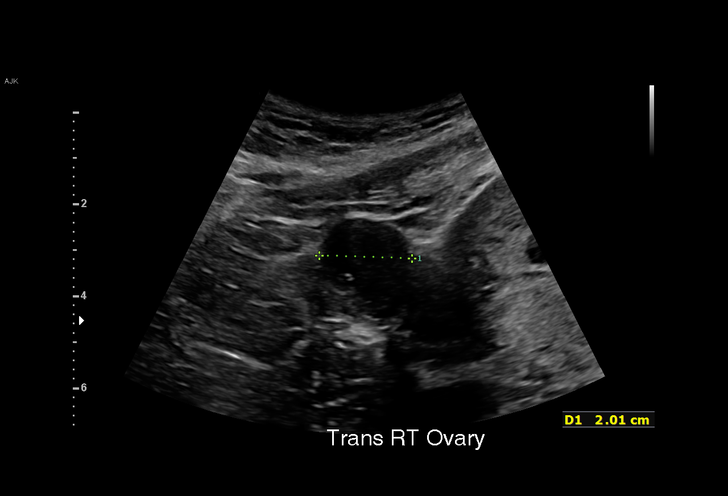
[im 27/39]
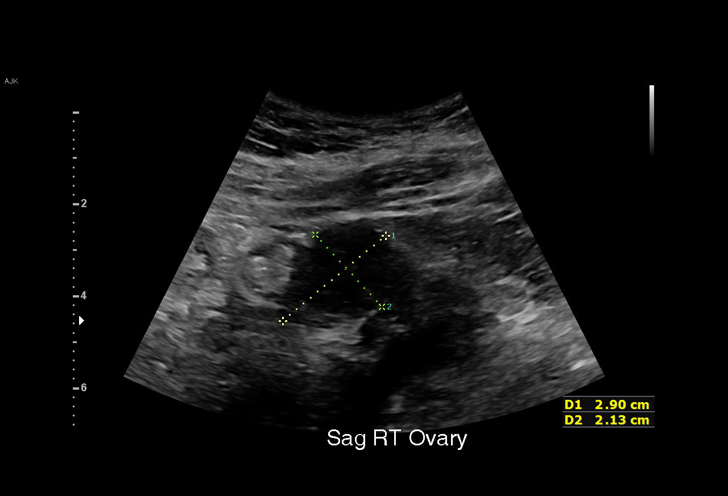
[im 30/39]
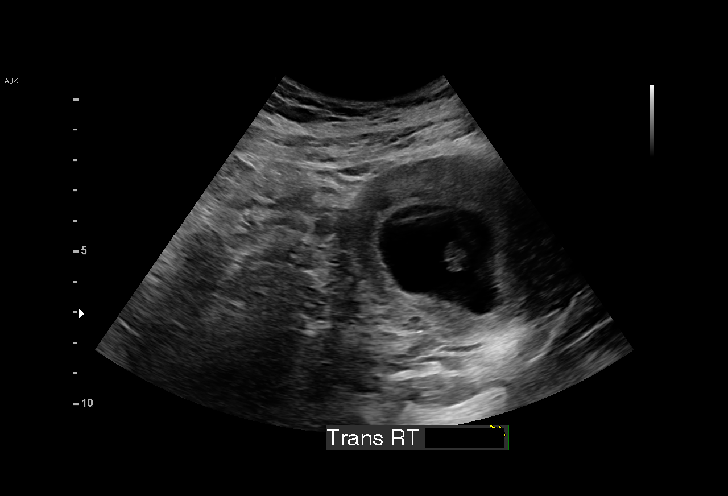
[im 33/39]
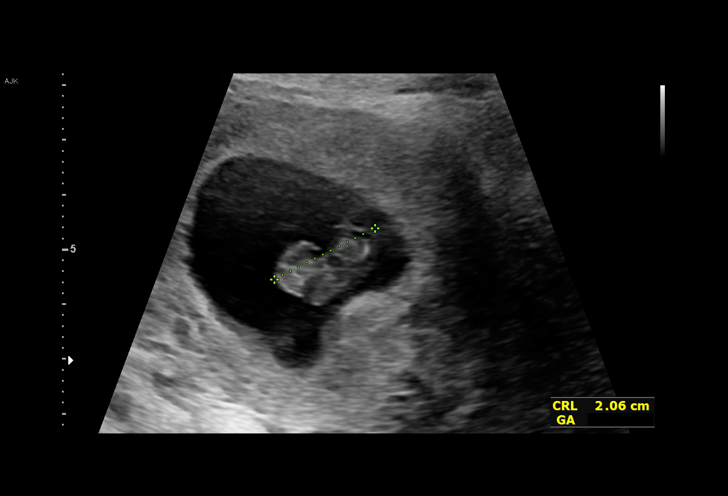
[im 36/39]
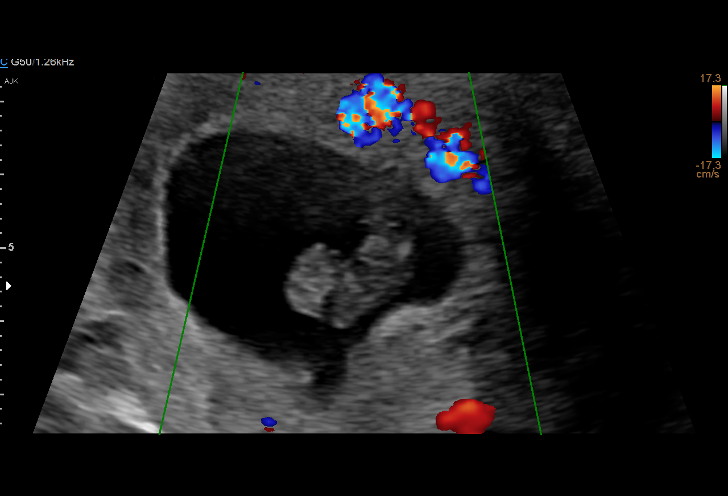
[im 39/39]
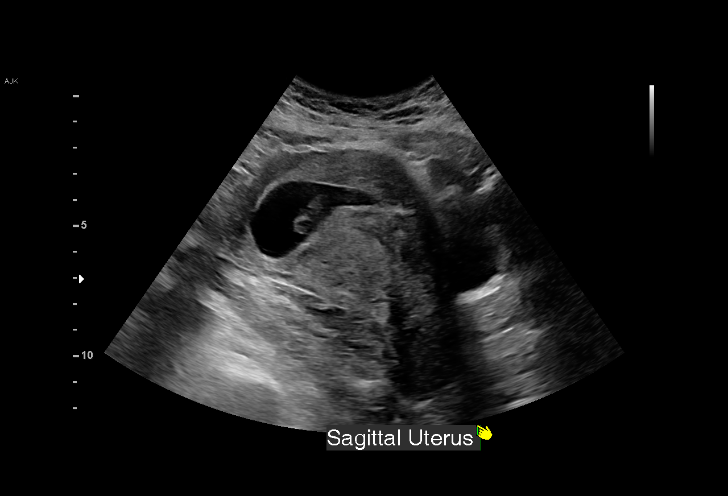

[15 of 28 positions shown; findings below may reference images not displayed]

FINDINGS: Intrauterine gestational sac: Present, single, slightly irregular

Yolk sac:  Not identified

Embryo:  Present

Cardiac Activity: Not identified

Heart Rate: N/A  bpm

CRL:  21.2 mm   8 w   5 d                  US EDC: 09/24/2022

Subchorionic hemorrhage:  None visualized.

Maternal uterus/adnexae:

Remainder of uterus unremarkable.

Ovaries normal appearance.

No free pelvic fluid or adnexal masses.
IMPRESSION: Intrauterine gestational sac containing a fetal pole which
corresponds to 8 weeks 5 days EGA.

No fetal cardiac activity identified, and no yolk sac seen.

Findings meet definitive criteria for failed pregnancy. This follows
SRU consensus guidelines: Diagnostic Criteria for Nonviable
Pregnancy Early in the First Trimester. N Engl J Med

## 2024-08-22 DIAGNOSIS — E66811 Obesity, class 1: Secondary | ICD-10-CM | POA: Diagnosis not present

## 2024-08-22 DIAGNOSIS — Z0000 Encounter for general adult medical examination without abnormal findings: Secondary | ICD-10-CM | POA: Diagnosis not present

## 2024-08-22 DIAGNOSIS — M25511 Pain in right shoulder: Secondary | ICD-10-CM | POA: Diagnosis not present

## 2024-08-22 DIAGNOSIS — Z23 Encounter for immunization: Secondary | ICD-10-CM | POA: Diagnosis not present

## 2024-08-22 DIAGNOSIS — Z13220 Encounter for screening for lipoid disorders: Secondary | ICD-10-CM | POA: Diagnosis not present

## 2024-08-22 DIAGNOSIS — R5383 Other fatigue: Secondary | ICD-10-CM | POA: Diagnosis not present

## 2024-08-22 DIAGNOSIS — G8929 Other chronic pain: Secondary | ICD-10-CM | POA: Diagnosis not present

## 2024-08-22 DIAGNOSIS — M25512 Pain in left shoulder: Secondary | ICD-10-CM | POA: Diagnosis not present
